# Patient Record
Sex: Female | Born: 1990 | Race: White | Hispanic: No | Marital: Single | Smoking: Never smoker
Health system: Southern US, Community
[De-identification: ages and names within clinical notes are randomized; demographics above are authoritative.]

---
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
## 2012-05-12 ENCOUNTER — Other Ambulatory Visit: Payer: Self-pay | Admitting: Nurse Practitioner

## 2012-05-13 ENCOUNTER — Other Ambulatory Visit: Payer: Self-pay | Admitting: Nurse Practitioner

## 2012-05-13 ENCOUNTER — Telehealth: Payer: Self-pay | Admitting: Nurse Practitioner

## 2012-05-13 NOTE — Telephone Encounter (Signed)
Patient needs a refill of her birth control to [facility] in [place]

## 2012-05-14 ENCOUNTER — Other Ambulatory Visit: Payer: Self-pay | Admitting: Nurse Practitioner

## 2012-05-14 MED ORDER — NORETHIN ACE-ETH ESTRAD-FE 1-20 MG-MCG PO TABS: 1.0000 | ORAL_TABLET | Freq: Every day | ORAL | Status: DC

## 2012-06-22 ENCOUNTER — Encounter: Payer: Self-pay | Admitting: *Deleted

## 2012-06-26 ENCOUNTER — Encounter: Payer: Self-pay | Admitting: Nurse Practitioner

## 2012-07-11 ENCOUNTER — Encounter: Payer: Self-pay | Admitting: Nurse Practitioner

## 2012-08-01 ENCOUNTER — Telehealth: Payer: Self-pay | Admitting: Nurse Practitioner

## 2012-08-01 NOTE — Telephone Encounter (Signed)
Patient needs a refill of her birth control [address]

## 2012-08-02 ENCOUNTER — Other Ambulatory Visit: Payer: Self-pay | Admitting: Nurse Practitioner

## 2012-08-02 MED ORDER — NORETHIN ACE-ETH ESTRAD-FE 1-20 MG-MCG PO TABS: 1.0000 | ORAL_TABLET | Freq: Every day | ORAL | Status: DC

## 2012-08-14 ENCOUNTER — Encounter: Payer: Self-pay | Admitting: Nurse Practitioner

## 2012-08-14 ENCOUNTER — Ambulatory Visit (INDEPENDENT_AMBULATORY_CARE_PROVIDER_SITE_OTHER): Payer: Self-pay | Admitting: Nurse Practitioner

## 2012-08-14 VITALS — BP 110/70 | HR 80 | Ht 62.0 in | Wt 126.2 lb

## 2012-08-14 DIAGNOSIS — Z634 Disappearance and death of family member: Secondary | ICD-10-CM | POA: Insufficient documentation

## 2012-08-14 DIAGNOSIS — F329 Major depressive disorder, single episode, unspecified: Secondary | ICD-10-CM

## 2012-08-14 DIAGNOSIS — Z733 Stress, not elsewhere classified: Secondary | ICD-10-CM

## 2012-08-14 DIAGNOSIS — F4321 Adjustment disorder with depressed mood: Secondary | ICD-10-CM

## 2012-08-14 DIAGNOSIS — R5383 Other fatigue: Secondary | ICD-10-CM

## 2012-08-14 LAB — POCT HEMOGLOBIN: Hemoglobin: 12 g/dL — AB (ref 12.2–16.2)

## 2012-08-14 MED ORDER — NORETHIN ACE-ETH ESTRAD-FE 1-20 MG-MCG PO TABS: 1.0000 | ORAL_TABLET | Freq: Every day | ORAL | Status: DC

## 2012-08-14 MED ORDER — CITALOPRAM HYDROBROMIDE 20 MG PO TABS: 20.0000 mg | ORAL_TABLET | Freq: Every day | ORAL | Status: DC

## 2012-08-14 NOTE — Assessment & Plan Note (Signed)
Plan: Start Celexa 20 mg half tab by mouth each bedtime. Recommend applying for Medicaid. Patient is currently uninsured. Patient to call local mental health center to schedule counseling. Recheck in 3 weeks, call back sooner if any problems. 

## 2012-08-14 NOTE — Progress Notes (Signed)
Subjective:  Presents for complaints of fatigue over the past several months. Sleep disturbance. Emotional lability. Increase appetite. Increase in headaches. Bowel issues. No acid reflux or heartburn. Generalized muscle aches. Lost her brother suddenly due to a car accident about 6 months ago. Lost her grandfather 2 weeks ago. Denies any suicidal or homicidal thoughts or ideation. Menstrual cycles are regular with light flow lasting 5-6 days. No missed pills. Has been with the same sexual partner. No pelvic pain or discharge. Has not been eating very healthy. Limited exercise. Mother is present with her today per her request.  Objective:   BP 110/70  Pulse 80  Ht 5\' 2"  (1.575 m)  Wt 126 lb 4 oz (57.267 kg)  BMI 23.09 kg/m2  LMP 08/07/2012 NAD. Alert, oriented. Mildly depressed affect. Lungs clear. Heart regular rate rhythm. Abdomen soft nondistended with minimal epigastric area tenderness.  Assessment:Depression  Other malaise and fatigue - Plan: POCT hemoglobin  Bereavement  Plan: Start Celexa 20 mg half tab by mouth each bedtime. Recommend applying for Medicaid. Patient is currently uninsured. Patient to call local mental health center to schedule counseling. Recheck in 3 weeks, call back sooner if any problems. Also given one year refill on her birth control pills. OTC Prilosec for her stomach. Recommend daily multivitamin with iron. Healthy diet. Increase activity.

## 2012-08-14 NOTE — Assessment & Plan Note (Signed)
Plan: Start Celexa 20 mg half tab by mouth each bedtime. Recommend applying for Medicaid. Patient is currently uninsured. Patient to call local mental health center to schedule counseling. Recheck in 3 weeks, call back sooner if any problems.

## 2012-08-14 NOTE — Patient Instructions (Signed)
Celexa (Citalopram) 1/2 at bedtime

## 2012-09-04 ENCOUNTER — Ambulatory Visit: Payer: Self-pay | Admitting: Nurse Practitioner

## 2013-08-29 ENCOUNTER — Other Ambulatory Visit: Payer: Self-pay | Admitting: Nurse Practitioner

## 2013-09-01 ENCOUNTER — Other Ambulatory Visit: Payer: Self-pay | Admitting: Nurse Practitioner

## 2013-09-10 ENCOUNTER — Telehealth: Payer: Self-pay | Admitting: Family Medicine

## 2013-09-10 MED ORDER — NORETHIN ACE-ETH ESTRAD-FE 1-20 MG-MCG PO TABS: ORAL_TABLET | ORAL | Status: DC

## 2013-09-10 NOTE — Telephone Encounter (Signed)
Pt needs refill on her BC pills, physical appt is 09/25/13 with Eber Jonesarolyn, [facility] the [facility]  MICROGESTIN FE 1/20 1-20 MG-MCG tablet

## 2013-09-10 NOTE — Telephone Encounter (Signed)
Med refill sent. Patient notified and verbalized understanding that she needs to keep her appt on 09/25/13

## 2013-09-10 NOTE — Telephone Encounter (Signed)
Correction, [facility] in [place]

## 2013-09-25 ENCOUNTER — Ambulatory Visit (INDEPENDENT_AMBULATORY_CARE_PROVIDER_SITE_OTHER): Payer: Self-pay | Admitting: Nurse Practitioner

## 2013-09-25 ENCOUNTER — Encounter: Payer: Self-pay | Admitting: Nurse Practitioner

## 2013-09-25 VITALS — BP 118/80 | Ht 62.0 in | Wt 131.0 lb

## 2013-09-25 DIAGNOSIS — Z01419 Encounter for gynecological examination (general) (routine) without abnormal findings: Secondary | ICD-10-CM

## 2013-09-25 DIAGNOSIS — F411 Generalized anxiety disorder: Secondary | ICD-10-CM

## 2013-09-25 DIAGNOSIS — Z0000 Encounter for general adult medical examination without abnormal findings: Secondary | ICD-10-CM

## 2013-09-25 DIAGNOSIS — F329 Major depressive disorder, single episode, unspecified: Secondary | ICD-10-CM

## 2013-09-25 DIAGNOSIS — F3289 Other specified depressive episodes: Secondary | ICD-10-CM

## 2013-09-25 DIAGNOSIS — F32A Depression, unspecified: Secondary | ICD-10-CM

## 2013-09-25 MED ORDER — NORETHIN ACE-ETH ESTRAD-FE 1-20 MG-MCG PO TABS: ORAL_TABLET | ORAL | Status: DC

## 2013-09-25 MED ORDER — CITALOPRAM HYDROBROMIDE 20 MG PO TABS: 20.0000 mg | ORAL_TABLET | Freq: Every day | ORAL | Status: DC

## 2013-09-25 MED ORDER — CLONAZEPAM 0.5 MG PO TABS: ORAL_TABLET | ORAL | Status: DC

## 2013-09-25 NOTE — Progress Notes (Signed)
Subjective:    Patient ID: Sherry Acosta, female    DOB: [birth year], 23 y.o.   MRN: [id number]  HPI presents with her mother for wellness physical. Sherry Acosta patient alone and with mother. Limited activity. Diet not very healthy. Not picky. Regular vision exams. Due for dental exam. Regular menses, light flow lasting 5 days. Recently broke up with her boyfriend, no new partners since last PE. Having depression and anxiety symptoms. See note July 2014. Did not take Celexa due to nonspecific concerns about side effects. Wants to start this. C/o fatigue, sleep disturbance and emotional lability. Denies suicidal or homicidal thoughts or ideation. Panic attacks at times. Her mother has given her one of her xanax pills on occasion. Has experienced depression off/on for years.     Review of Systems  Constitutional: Positive for fatigue. Negative for fever, activity change and appetite change.  HENT: Negative for congestion, dental problem, ear pain, sinus pressure and sore throat.   Respiratory: Negative for cough, chest tightness, shortness of breath and wheezing.   Cardiovascular: Negative for chest pain.  Gastrointestinal: Positive for diarrhea. Negative for nausea, vomiting, abdominal pain, constipation, blood in stool and abdominal distention.       No GERD or heartburn.  Genitourinary: Negative for dysuria, urgency, frequency, vaginal discharge, enuresis, difficulty urinating, menstrual problem and pelvic pain.  Neurological: Positive for headaches.       Dull occipital area headaches.  Psychiatric/Behavioral: Positive for sleep disturbance, dysphoric mood and agitation. Negative for suicidal ideas. The patient is nervous/anxious.        Objective:   Physical Exam  Constitutional: She is oriented to person, place, and time. She appears well-developed. No distress.  HENT:  Right Ear: External ear normal.  Left Ear: External ear normal.  Mouth/Throat: Oropharynx is clear and moist.    Neck: Normal range of motion. Neck supple. No tracheal deviation present.  Cardiovascular: Normal rate, regular rhythm and normal heart sounds.  Exam reveals no gallop.   No murmur heard. Pulmonary/Chest: Effort normal and breath sounds normal.  Abdominal: Soft. She exhibits no distension. There is no tenderness.  Genitourinary:  Deferred on cycle today.  Musculoskeletal: She exhibits no edema.  Lymphadenopathy:    She has no cervical adenopathy.  Neurological: She is alert and oriented to person, place, and time.  Skin: Skin is warm and dry. No rash noted.  Psychiatric: She has a normal mood and affect. Her behavior is normal. Thought content normal.  Breast exam: dense tissue, no masses; axillae no adenopathy.        Assessment & Plan:  Well woman exam  Depression  Anxiety state, unspecified  Meds ordered this encounter  Medications  . norethindrone-ethinyl estradiol (MICROGESTIN FE 1/20) 1-20 MG-MCG tablet    Sig: TAKE ONE TABLET BY MOUTH ONCE DAILY    Dispense:  1 Package    Refill:  11    Order Specific Question:  Supervising Provider    Answer:  Merlyn Albert [[id number]]  . citalopram (CELEXA) 20 MG tablet    Sig: Take 1 tablet (20 mg total) by mouth daily. At bedtime    Dispense:  30 tablet    Refill:  0    Order Specific Question:  Supervising Provider    Answer:  Merlyn Albert [[id number]]  . clonazePAM (KLONOPIN) 0.5 MG tablet    Sig: 1/2-1 tab po BID prn anxiety attacks    Dispense:  20 tablet    Refill:  0  Order Specific Question:  Supervising Provider    Answer:  Merlyn Albert [[id number]]   Start celexa 20 1/2 tab po qhs. If no problems, increase to one tab after 6 days. D/C med and call if any problems. Use klonopin sparingly, only for anxiety attacks. Defers labs and mental health counseling at this time. Encouraged healthy diet and regular activity.  Return in about 1 month (around 10/25/2013).

## 2013-10-24 ENCOUNTER — Ambulatory Visit: Payer: Self-pay | Admitting: Nurse Practitioner

## 2013-10-26 ENCOUNTER — Other Ambulatory Visit: Payer: Self-pay | Admitting: Nurse Practitioner

## 2013-11-24 ENCOUNTER — Other Ambulatory Visit: Payer: Self-pay | Admitting: Family Medicine

## 2013-11-24 MED ORDER — CITALOPRAM HYDROBROMIDE 20 MG PO TABS: ORAL_TABLET | ORAL | Status: DC

## 2013-11-24 NOTE — Telephone Encounter (Signed)
Patient needs refill on citalopram (CELEXA) 20 MG tablet  [facility] [place]

## 2013-11-24 NOTE — Telephone Encounter (Signed)
Patient notified that medication refill has been sent to pharmacy.

## 2019-12-30 ENCOUNTER — Other Ambulatory Visit: Payer: Self-pay

## 2019-12-30 ENCOUNTER — Ambulatory Visit: Payer: Managed Care, Other (non HMO) | Admitting: Podiatry

## 2019-12-30 ENCOUNTER — Ambulatory Visit (INDEPENDENT_AMBULATORY_CARE_PROVIDER_SITE_OTHER): Payer: Managed Care, Other (non HMO)

## 2019-12-30 ENCOUNTER — Telehealth: Payer: Self-pay

## 2019-12-30 DIAGNOSIS — S90859A Superficial foreign body, unspecified foot, initial encounter: Secondary | ICD-10-CM

## 2019-12-30 DIAGNOSIS — M79671 Pain in right foot: Secondary | ICD-10-CM

## 2019-12-30 DIAGNOSIS — S90851A Superficial foreign body, right foot, initial encounter: Secondary | ICD-10-CM | POA: Diagnosis not present

## 2019-12-30 NOTE — Patient Instructions (Signed)

## 2019-12-30 NOTE — Telephone Encounter (Signed)
DOS 12/31/2019  EXC FOREIGN BODY RT - 28020 REMOVAL FOREIGN BODY - 10120  [employer] EFFECTIVE DATE - 01/24/2019  PLAN DEDUCTIBLE - $3750.00 W/ $0.00 MET OUT OF POCKET - $7900.00 W/ $50.00 MET COPAY $0.00 COINSURANCE - 70%  PER AUTOMATED SYSTEM NO PRECERT IS REQUIRED FOR CPT 28020 CONF # 21006 AND 77939 CONF # [id number]

## 2020-01-05 NOTE — Progress Notes (Signed)
Subjective:   Patient ID: Sherry Acosta, female   DOB: 29 y.o.   MRN: [id number]   HPI 29 year old female presents the office today for concerns of foreign body in her right foot.  She states that she stepped on a piece of glass on December 14, 2019.  She states that she was unable to get it all out.  She did see another physician for this and they tried to get it out but was not able to do so.  The patient feels that the glasses moved.  The area is tender with pressure.  She has no other concerns today.  Currently denies any fevers, chills, nausea, vomiting.   Review of Systems  All other systems reviewed and are negative.  No past medical history on file.  No past surgical history on file.   Current Outpatient Medications:    citalopram (CELEXA) 20 MG tablet, TAKE ONE TABLET BY MOUTH AT BEDTIME, Disp: 30 tablet, Rfl: 5   clonazePAM (KLONOPIN) 0.5 MG tablet, 1/2-1 tab po BID prn anxiety attacks, Disp: 20 tablet, Rfl: 0   norethindrone-ethinyl estradiol (MICROGESTIN FE 1/20) 1-20 MG-MCG tablet, TAKE ONE TABLET BY MOUTH ONCE DAILY, Disp: 1 Package, Rfl: 11  Allergies  Allergen Reactions   Amoxicillin     rash   Strawberry Extract     Causes hives    Sulfa Antibiotics Hives        Objective:  Physical Exam  General: AAO x3, NAD  Dermatological: No open puncture wound is evident but able to visualize where likely the foreign body entered.  There is localized edema to the area there is no erythema.  There is no fluctuation or crepitation.  There is no malodor.  Vascular: Dorsalis Pedis artery and Posterior Tibial artery pedal pulses are 2/4 bilateral with immedate capillary fill time. There is no pain with calf compression, swelling, warmth, erythema.   Neruologic: Grossly intact via light touch bilateral.   Musculoskeletal: Tenderness on the area of the foreign body.  No other areas of discomfort.  Gait: Unassisted, Nonantalgic.       Assessment:   Likely  residual foreign body right foot     Plan:  -Treatment options discussed including all alternatives, risks, and complications -Etiology of symptoms were discussed -X-rays obtained reviewed.  No evidence of foreign body identified on x-ray. -Given there is been there for couple weeks and that also try to get him previously not able to do so I recommended incision and drainage, foreign body excision in the operating room.  We will plan on doing this on Wednesday. -The incision placement as well as the postoperative course was discussed with the patient. I discussed risks of the surgery which include, but not limited to, infection, bleeding, pain, swelling, need for further surgery, delayed or nonhealing, painful or ugly scar, numbness or sensation changes, over/under correction, recurrence, transfer lesions, further deformity, DVT/PE, loss of toe/foot. Patient understands these risks and wishes to proceed with surgery. The surgical consent was reviewed with the patient all 3 pages were signed. No promises or guarantees were given to the outcome of the procedure. All questions were answered to the best of my ability. Before the surgery the patient was encouraged to call the office if there is any further questions. The surgery will be performed at the [facility] on an outpatient basis.  No follow-ups on file.  Vivi Barrack DPM

## 2020-01-06 ENCOUNTER — Encounter: Payer: Managed Care, Other (non HMO) | Admitting: Podiatry

## 2020-01-15 ENCOUNTER — Encounter: Payer: Managed Care, Other (non HMO) | Admitting: Podiatry

## 2020-01-29 ENCOUNTER — Encounter: Payer: Managed Care, Other (non HMO) | Admitting: Podiatry

## 2020-12-07 ENCOUNTER — Emergency Department (HOSPITAL_COMMUNITY)
Admission: EM | Admit: 2020-12-07 | Discharge: 2020-12-08 | Disposition: A | Discharge status: Home / Self Care | Payer: Managed Care, Other (non HMO) | Attending: Emergency Medicine | Admitting: Emergency Medicine

## 2020-12-07 ENCOUNTER — Other Ambulatory Visit: Payer: Self-pay

## 2020-12-07 DIAGNOSIS — D649 Anemia, unspecified: Secondary | ICD-10-CM | POA: Insufficient documentation

## 2020-12-07 DIAGNOSIS — R0789 Other chest pain: Secondary | ICD-10-CM | POA: Insufficient documentation

## 2020-12-07 DIAGNOSIS — M791 Myalgia, unspecified site: Secondary | ICD-10-CM | POA: Diagnosis not present

## 2020-12-07 DIAGNOSIS — R079 Chest pain, unspecified: Secondary | ICD-10-CM | POA: Diagnosis present

## 2020-12-07 DIAGNOSIS — Z79899 Other long term (current) drug therapy: Secondary | ICD-10-CM | POA: Insufficient documentation

## 2020-12-07 NOTE — ED Provider Notes (Signed)
Emergency Medicine Provider Triage Evaluation Note  Sherry Acosta , a 29 y.o. female  was evaluated in triage.  Pt complains of left upper chest pain worse with movement but not exertion.  Review of Systems  Positive: Chest pain, recent cough Negative: Shortness of breath, leg swelling, exertional symptoms,  Physical Exam  BP (!) 150/84 (BP Location: Right Arm)   Pulse (!) 106   Temp 98 F (36.7 C) (Oral)   Resp 16   Ht 5\' 2"  (1.575 m)   Wt 88 kg   SpO2 100%   BMI 35.48 kg/m  Gen:   Awake, no distress   Resp:  Normal effort  MSK:   Moves extremities without difficulty  Other:    Medical Decision Making  Medically screening exam initiated at 11:47 PM.  Appropriate orders placed.  Sherry Acosta was informed that the remainder of the evaluation will be completed by another provider, this initial triage assessment does not replace that evaluation, and the importance of remaining in the ED until their evaluation is complete.     Daune Perch, MD 12/07/20 [phone]

## 2020-12-07 NOTE — ED Triage Notes (Signed)
Pt had seen her PCP for chest pain and had labs ordered which resulted and she was called and told to come to the ED for an elevated D Dimer

## 2020-12-08 ENCOUNTER — Emergency Department (HOSPITAL_COMMUNITY): Payer: Managed Care, Other (non HMO)

## 2020-12-08 LAB — CBC WITH DIFFERENTIAL/PLATELET
Abs Immature Granulocytes: 0.03 10*3/uL (ref 0.00–0.07)
Basophils Absolute: 0.1 10*3/uL (ref 0.0–0.1)
Basophils Relative: 1 %
Eosinophils Absolute: 0.4 10*3/uL (ref 0.0–0.5)
Eosinophils Relative: 5 %
HCT: 30.3 % — ABNORMAL LOW (ref 36.0–46.0)
Hemoglobin: 9.4 g/dL — ABNORMAL LOW (ref 12.0–15.0)
Immature Granulocytes: 0 %
Lymphocytes Relative: 29 %
Lymphs Abs: 2.1 10*3/uL (ref 0.7–4.0)
MCH: 24.9 pg — ABNORMAL LOW (ref 26.0–34.0)
MCHC: 31 g/dL (ref 30.0–36.0)
MCV: 80.2 fL (ref 80.0–100.0)
Monocytes Absolute: 0.6 10*3/uL (ref 0.1–1.0)
Monocytes Relative: 8 %
Neutro Abs: 4.1 10*3/uL (ref 1.7–7.7)
Neutrophils Relative %: 57 %
Platelets: 433 10*3/uL — ABNORMAL HIGH (ref 150–400)
RBC: 3.78 MIL/uL — ABNORMAL LOW (ref 3.87–5.11)
RDW: 14.6 % (ref 11.5–15.5)
WBC: 7.2 10*3/uL (ref 4.0–10.5)
nRBC: 0 % (ref 0.0–0.2)

## 2020-12-08 LAB — COMPREHENSIVE METABOLIC PANEL
ALT: 18 U/L (ref 0–44)
AST: 23 U/L (ref 15–41)
Albumin: 3.2 g/dL — ABNORMAL LOW (ref 3.5–5.0)
Alkaline Phosphatase: 62 U/L (ref 38–126)
Anion gap: 8 (ref 5–15)
BUN: 14 mg/dL (ref 6–20)
CO2: 23 mmol/L (ref 22–32)
Calcium: 8.7 mg/dL — ABNORMAL LOW (ref 8.9–10.3)
Chloride: 106 mmol/L (ref 98–111)
Creatinine, Ser: 0.71 mg/dL (ref 0.44–1.00)
GFR, Estimated: >60 mL/min (ref >60)
Glucose, Bld: 99 mg/dL (ref 70–99)
Potassium: 3.9 mmol/L (ref 3.5–5.1)
Sodium: 137 mmol/L (ref 135–145)
Total Bilirubin: 0.4 mg/dL (ref 0.3–1.2)
Total Protein: 6.8 g/dL (ref 6.5–8.1)

## 2020-12-08 LAB — D-DIMER, QUANTITATIVE: D-Dimer, Quant: 1.01 ug/mL-FEU — ABNORMAL HIGH (ref 0.00–0.50)

## 2020-12-08 LAB — HCG, QUANTITATIVE, PREGNANCY: hCG, Beta Chain, Quant, S: <1 m[IU]/mL (ref <5)

## 2020-12-08 MED ORDER — IOHEXOL 350 MG/ML SOLN
50.0000 mL | Freq: Once | INTRAVENOUS | Status: AC | PRN
  Administered 2020-12-08: 50 mL via INTRAVENOUS

## 2020-12-08 NOTE — ED Notes (Signed)
Pt discharged and ambulated out of the ED without difficulty. 

## 2020-12-08 NOTE — ED Provider Notes (Signed)
[facility] EMERGENCY DEPARTMENT Provider Note   CSN: [id number] Arrival date & time: 12/07/20  2301     History Chief Complaint  Patient presents with   Abnormal Lab    Sherry Acosta is a 30 y.o. female.  30 year old female who presents emerged from today secondary to left-sided chest pain.  Worse with movement and palpation.  She went to see her primary doctor who ordered a D-dimer which was elevated so sent her here for CT scan.  She has no shortness of breath.  No fevers.  No productive cough.  No lower extremity swelling or pain   Abnormal Lab     No past medical history on file.  Patient Active Problem List   Diagnosis Date Noted   Anxiety state, unspecified 09/25/2013   Bereavement 08/14/2012   Depression 08/14/2012    No past surgical history on file.   OB History   No obstetric history on file.     Family History  Problem Relation Age of Onset   Cancer Maternal Grandmother 49       Lung cancer; smoker   COPD Paternal Grandmother    Thyroid disease Mother    Mental illness Mother     Social History   Tobacco Use   Smoking status: Never   Smokeless tobacco: Never  Substance Use Topics   Alcohol use: Yes    Comment: occas social   Drug use: No    Home Medications Prior to Admission medications   Medication Sig Start Date End Date Taking? Authorizing Provider  citalopram (CELEXA) 20 MG tablet TAKE ONE TABLET BY MOUTH AT BEDTIME 11/24/13   Babs Sciara, MD  clonazePAM (KLONOPIN) 0.5 MG tablet 1/2-1 tab po BID prn anxiety attacks 09/25/13   Campbell Riches, NP  norethindrone-ethinyl estradiol (MICROGESTIN FE 1/20) 1-20 MG-MCG tablet TAKE ONE TABLET BY MOUTH ONCE DAILY 09/25/13   Campbell Riches, NP    Allergies    Amoxicillin, Strawberry extract, and Sulfa antibiotics  Review of Systems   Review of Systems  All other systems reviewed and are negative.  Physical Exam Updated Vital Signs BP 132/88   Pulse 87    Temp 98.1 F (36.7 C) (Oral)   Resp 16   Ht 5\' 2"  (1.575 m)   Wt 88 kg   SpO2 100%   BMI 35.48 kg/m   Physical Exam Vitals and nursing note reviewed.  Constitutional:      Appearance: She is well-developed.  HENT:     Head: Normocephalic and atraumatic.     Mouth/Throat:     Mouth: Mucous membranes are moist.     Pharynx: Oropharynx is clear.  Eyes:     Pupils: Pupils are equal, round, and reactive to light.  Cardiovascular:     Rate and Rhythm: Normal rate and regular rhythm.  Pulmonary:     Effort: No respiratory distress.     Breath sounds: No stridor.  Abdominal:     General: Abdomen is flat. There is no distension.  Musculoskeletal:        General: Tenderness (left upper chest) present.     Cervical back: Normal range of motion.  Skin:    General: Skin is warm and dry.  Neurological:     General: No focal deficit present.     Mental Status: She is alert.    ED Results / Procedures / Treatments   Labs (all labs ordered are listed, but only abnormal results are displayed)  Labs Reviewed  CBC WITH DIFFERENTIAL/PLATELET - Abnormal; Notable for the following components:      Result Value   RBC 3.78 (*)    Hemoglobin 9.4 (*)    HCT 30.3 (*)    MCH 24.9 (*)    Platelets 433 (*)    All other components within normal limits  COMPREHENSIVE METABOLIC PANEL - Abnormal; Notable for the following components:   Calcium 8.7 (*)    Albumin 3.2 (*)    All other components within normal limits  D-DIMER, QUANTITATIVE - Abnormal; Notable for the following components:   D-Dimer, Quant 1.01 (*)    All other components within normal limits  HCG, QUANTITATIVE, PREGNANCY    EKG None  Radiology DG Chest 2 View  Result Date: 12/08/2020 CLINICAL DATA:  Left-sided chest pain. EXAM: CHEST - 2 VIEW COMPARISON:  None. FINDINGS: The cardiomediastinal contours are normal. The lungs are clear. Pulmonary vasculature is normal. No consolidation, pleural effusion, or pneumothorax. No  acute osseous abnormalities are seen. IMPRESSION: Negative radiographs of the chest. Electronically Signed   By: Narda Rutherford M.D.   On: 12/08/2020 00:12   CT Angio Chest PE W and/or Wo Contrast  Result Date: 12/08/2020 CLINICAL DATA:  Elevated D-dimer and chest pain, initial encounter EXAM: CT ANGIOGRAPHY CHEST WITH CONTRAST TECHNIQUE: Multidetector CT imaging of the chest was performed using the standard protocol during bolus administration of intravenous contrast. Multiplanar CT image reconstructions and MIPs were obtained to evaluate the vascular anatomy. CONTRAST:  50mL OMNIPAQUE IOHEXOL 350 MG/ML SOLN COMPARISON:  Chest x-ray from earlier in the same day. FINDINGS: Cardiovascular: Thoracic aorta and its branches show no significant atherosclerotic calcifications. No aneurysmal dilatation or dissection is seen. No cardiac enlargement is noted. Pulmonary artery shows a normal branching pattern bilaterally. No intraluminal filling defect to suggest pulmonary embolism is identified. Mediastinum/Nodes: Thoracic inlet is within normal limits. No sizable hilar or mediastinal adenopathy is noted. Scattered mildly prominent but symmetrical axillary lymph nodes are noted measuring up to 10 mm in short axis. The esophagus as visualized is within normal limits. Lungs/Pleura: Lungs are well aerated bilaterally. No focal infiltrate or sizable effusion is seen. No parenchymal nodules are noted. Upper Abdomen: Visualized upper abdomen is unremarkable. Musculoskeletal: No acute rib abnormality is noted. No acute bony abnormality is seen. Review of the MIP images confirms the above findings. IMPRESSION: No evidence of pulmonary emboli. No infiltrate or effusion is seen. Symmetrical axillary lymph nodes are seen and mildly prominent likely reactive in nature. No other focal abnormality is identified. Electronically Signed   By: Alcide Clever M.D.   On: 12/08/2020 02:34    Procedures Procedures   Medications Ordered  in ED Medications  iohexol (OMNIPAQUE) 350 MG/ML injection 50 mL (50 mLs Intravenous Contrast Given 12/08/20 0216)    ED Course  I have reviewed the triage vital signs and the nursing notes.  Pertinent labs & imaging results that were available during my care of the patient were reviewed by me and considered in my medical decision making (see chart for details).    MDM Rules/Calculators/A&P                         Patient with likely musculoskeletal causes for her pain.  Her D-dimer was elevated here as well so a CT scan was done which was negative.  Patient stable for discharge.  She was found to have anemia.  The nurse discharged her before I did chance  to discuss with her so I called her on the phone and informed her that her hemoglobin is a little bit low.  Not requiring transfusion not likely related to her symptoms more likely related to her menstrual cycle she is on currently.  She will follow-up with her primary doctor for both these issues.   Final Clinical Impression(s) / ED Diagnoses Final diagnoses:  Anemia, unspecified type  Musculoskeletal chest pain    Rx / DC Orders ED Discharge Orders     None        Aryianna Earwood, Barbara Cower, MD 12/08/20 [phone]

## 2020-12-31 ENCOUNTER — Other Ambulatory Visit: Payer: Self-pay

## 2020-12-31 ENCOUNTER — Institutional Professional Consult (permissible substitution): Payer: Managed Care, Other (non HMO) | Admitting: Pulmonary Disease

## 2021-01-04 ENCOUNTER — Other Ambulatory Visit: Payer: Self-pay

## 2021-01-04 ENCOUNTER — Ambulatory Visit: Payer: Managed Care, Other (non HMO) | Admitting: Pulmonary Disease

## 2021-01-04 ENCOUNTER — Encounter: Payer: Self-pay | Admitting: Pulmonary Disease

## 2021-01-04 VITALS — BP 118/82 | HR 92 | Temp 98.2°F | Ht 62.0 in | Wt 192.0 lb

## 2021-01-04 DIAGNOSIS — J4540 Moderate persistent asthma, uncomplicated: Secondary | ICD-10-CM

## 2021-01-04 DIAGNOSIS — R053 Chronic cough: Secondary | ICD-10-CM

## 2021-01-04 MED ORDER — FLUTICASONE PROPIONATE 50 MCG/ACT NA SUSP: 2.0000 | Freq: Every day | NASAL | 6 refills | Status: DC

## 2021-01-04 MED ORDER — FLUTICASONE-SALMETEROL 500-50 MCG/ACT IN AEPB: 1.0000 | INHALATION_SPRAY | Freq: Two times a day (BID) | RESPIRATORY_TRACT | 6 refills | Status: DC

## 2021-01-04 NOTE — Patient Instructions (Addendum)
Nice to meet you  I think the cough is likely related to your nasal congestion as well as possibly related to underlying asthma  For the nasal congestion I recommend the following  1) sinus rinse twice a day for 2 weeks then once daily after 2 weeks  2) Flonase 2 sprays each nostril twice a day for 7 days then 2 sprays each nostril once daily -I sent a prescription but it may be cheaper over-the-counter, just check the price difference  3) Afrin 2 sprays each nostril twice a day for 3 days then stop  4) new medication a pill cutter montelukast 10 mg at night  5) continue Xyzal as you are taking  Please make sure to do all of the medications after you do the sinus rinse.  If you do the sinus rinse last, able to wash out any medication.  For the chest tightness which I believe is related to asthma, use Advair 1 puff twice a day.  Please rinse your mouth out after every use.  If cost is too high, ask the pharmacist if there is a cheaper option with your insurance plan and then let us know.  If they are not helpful, please contact our office for further instructions.  Return to clinic in 2 months or sooner as needed with Dr. Judeth Horn

## 2021-01-05 NOTE — Progress Notes (Signed)
@Patient  ID: , female    DOB: [birth year], 30 y.o.   MRN: [id number]  Chief Complaint  Patient presents with   Consult    Consult for prolonged cough and chest pain that has been present for 2 months and nasal congestion for 4 years.     Referring provider: [id number], MD  HPI:   30 y.o. whom we are seeing in consultation for evaluation of chronic cough and dyspnea on exertion.  Note from referring provider reviewed.  ED note 12/08/2020 reviewed.  Patient reports onset of dyspnea exertion over the last few months.  Worse over the last few weeks.  Associated chest tightness, inability to take deep breath.  Seems a bit worse in the evenings.  No other time there were things better or worse.  No position makes it better or worse.  No seasonal environmental factors she can identify that make things better or worse.  No other alleviating or exacerbating factors.  Seems to be triggered by viral infection.  She was seen in the ED 11/15 into 12/08/2020.  Chest x-ray 11/16 reviewed interpreted as clear lungs bilaterally.  CTA PE protocol 11/16 reviewed interpreted as clear lungs bilaterally, no parenchymal issues, no pulmonary embolus identified on my interpretation.  She reports longstanding history of seasonal allergies.  A lot of nasal congestion, sinus congestion.  Chronic cough for about 4 years.  Worse in the evenings.  Has tried several antihistamines without much improvement.  She endorsed some face tingling at times.  PMH: Seasonal allergies, anxiety Surgical history:History reviewed. No pertinent surgical history. Family history: Mother thyroid disease, mental illness Social history: Never smoker, lives in [place], works at [employer] / Pulmonary Flowsheets:   ACT:  No flowsheet data found.  MMRC: No flowsheet data found.  Epworth:  No flowsheet data found.  Tests:   FENO:  No results found for: NITRICOXIDE  PFT: No flowsheet data  found.  WALK:  No flowsheet data found.  Imaging: Personally reviewed and as per EMR discussion of this note DG Chest 2 View  Result Date: 12/08/2020 CLINICAL DATA:  Left-sided chest pain. EXAM: CHEST - 2 VIEW COMPARISON:  None. FINDINGS: The cardiomediastinal contours are normal. The lungs are clear. Pulmonary vasculature is normal. No consolidation, pleural effusion, or pneumothorax. No acute osseous abnormalities are seen. IMPRESSION: Negative radiographs of the chest. Electronically Signed   By: 12/10/2020 M.D.   On: 12/08/2020 00:12   CT Angio Chest PE W and/or Wo Contrast  Result Date: 12/08/2020 CLINICAL DATA:  Elevated D-dimer and chest pain, initial encounter EXAM: CT ANGIOGRAPHY CHEST WITH CONTRAST TECHNIQUE: Multidetector CT imaging of the chest was performed using the standard protocol during bolus administration of intravenous contrast. Multiplanar CT image reconstructions and MIPs were obtained to evaluate the vascular anatomy. CONTRAST:  56mL OMNIPAQUE IOHEXOL 350 MG/ML SOLN COMPARISON:  Chest x-ray from earlier in the same day. FINDINGS: Cardiovascular: Thoracic aorta and its branches show no significant atherosclerotic calcifications. No aneurysmal dilatation or dissection is seen. No cardiac enlargement is noted. Pulmonary artery shows a normal branching pattern bilaterally. No intraluminal filling defect to suggest pulmonary embolism is identified. Mediastinum/Nodes: Thoracic inlet is within normal limits. No sizable hilar or mediastinal adenopathy is noted. Scattered mildly prominent but symmetrical axillary lymph nodes are noted measuring up to 10 mm in short axis. The esophagus as visualized is within normal limits. Lungs/Pleura: Lungs are well aerated bilaterally. No focal infiltrate or sizable effusion is seen. No parenchymal  nodules are noted. Upper Abdomen: Visualized upper abdomen is unremarkable. Musculoskeletal: No acute rib abnormality is noted. No acute bony  abnormality is seen. Review of the MIP images confirms the above findings. IMPRESSION: No evidence of pulmonary emboli. No infiltrate or effusion is seen. Symmetrical axillary lymph nodes are seen and mildly prominent likely reactive in nature. No other focal abnormality is identified. Electronically Signed   By: Alcide Clever M.D.   On: 12/08/2020 02:34    Lab Results: Personally reviewed, notably eosinophils 400 in past CBC    Component Value Date/Time   WBC 7.2 12/07/2020 2352   RBC 3.78 (L) 12/07/2020 2352   HGB 9.4 (L) 12/07/2020 2352   HCT 30.3 (L) 12/07/2020 2352   PLT 433 (H) 12/07/2020 2352   MCV 80.2 12/07/2020 2352   MCH 24.9 (L) 12/07/2020 2352   MCHC 31.0 12/07/2020 2352   RDW 14.6 12/07/2020 2352   LYMPHSABS 2.1 12/07/2020 2352   MONOABS 0.6 12/07/2020 2352   EOSABS 0.4 12/07/2020 2352   BASOSABS 0.1 12/07/2020 2352    BMET    Component Value Date/Time   NA 137 12/07/2020 2352   K 3.9 12/07/2020 2352   CL 106 12/07/2020 2352   CO2 23 12/07/2020 2352   GLUCOSE 99 12/07/2020 2352   BUN 14 12/07/2020 2352   CREATININE 0.71 12/07/2020 2352   CALCIUM 8.7 (L) 12/07/2020 2352   GFRNONAA >60 12/07/2020 2352    BNP No results found for: BNP  ProBNP No results found for: PROBNP  Specialty Problems   None   Allergies  Allergen Reactions   Amoxicillin     rash   Strawberry Extract     Causes hives    Sulfa Antibiotics Hives     There is no immunization history on file for this patient.  History reviewed. No pertinent past medical history.  Tobacco History: Social History   Tobacco Use  Smoking Status Never  Smokeless Tobacco Never   Counseling given: Not Answered   Continue to not smoke  Outpatient Encounter Medications as of 01/04/2021  Medication Sig   FLUoxetine (PROZAC) 20 MG capsule 1 capsule   fluticasone (FLONASE) 50 MCG/ACT nasal spray Place 2 sprays into both nostrils daily.   fluticasone-salmeterol (ADVAIR) 500-50 MCG/ACT AEPB  Inhale 1 puff into the lungs in the morning and at bedtime.   norethindrone-ethinyl estradiol (MICROGESTIN FE 1/20) 1-20 MG-MCG tablet TAKE ONE TABLET BY MOUTH ONCE DAILY   citalopram (CELEXA) 20 MG tablet TAKE ONE TABLET BY MOUTH AT BEDTIME (Patient not taking: Reported on 01/04/2021)   clonazePAM (KLONOPIN) 0.5 MG tablet 1/2-1 tab po BID prn anxiety attacks (Patient not taking: Reported on 01/04/2021)   No facility-administered encounter medications on file as of 01/04/2021.     Review of Systems  Review of Systems  No chest evaluation.  No orthopnea or PND.  Comprehensive review of systems otherwise negative. Physical Exam  BP 118/82 (BP Location: Left Arm, Patient Position: Sitting, Cuff Size: Normal)    Pulse 92    Temp 98.2 F (36.8 C) (Oral)    Ht 5\' 2"  (1.575 m)    Wt 192 lb (87.1 kg)    SpO2 99%    BMI 35.12 kg/m   Wt Readings from Last 5 Encounters:  01/04/21 192 lb (87.1 kg)  12/07/20 194 lb (88 kg)  09/25/13 131 lb (59.4 kg)  08/14/12 126 lb 4 oz (57.3 kg)    BMI Readings from Last 5 Encounters:  01/04/21 35.12 kg/m  12/07/20 35.48 kg/m  09/25/13 23.96 kg/m  08/14/12 23.09 kg/m     Physical Exam General well-appearing, no acute distress Eyes: EOMI, icterus Neck: Supple, no JVP Pulmonary: Clear, no work of breathing, no wheeze Cardiovascular: Regular rate and rhythm, no murmur Abdomen: Nondistended, bowel sounds present MSK: No synovitis, no joint effusion Neuro: Normal gait, no weakness Psych: Normal mood, full affect   Assessment & Plan:   Chest tightness, dyspnea on exertion: Triggered by what sounds like viral infection and worsened recently.  Suspect postviral activation of asthma or reactive airways disease.  ICS/LABA therapy via high-dose Advair.  Chronic cough: Suspect other factorial in the setting of likely asthma, seasonal allergies/rhinitis, postnasal drip.  ICS/LABA as a above.  Flonase prescribed in addition.  To continue Xyzal.  She is  concerned about potential mast cell activation given facial tingling.  No frank hives.  No other consider allergy referral in the future if hives or symptoms were to worsen or escalation in nasal regimen is unsuccessful in helping cough.  Asthma: Clinical diagnosis in the setting of atopic symptoms, chest tightness and dyspnea triggered by viral infection.  ICS/LABA as above.  Return in about 2 months (around 03/07/2021).   Karren Burly, MD 01/05/2021

## 2021-02-01 ENCOUNTER — Other Ambulatory Visit (HOSPITAL_COMMUNITY): Payer: Self-pay

## 2021-02-01 ENCOUNTER — Telehealth: Payer: Self-pay | Admitting: Pharmacy Technician

## 2021-02-01 NOTE — Telephone Encounter (Signed)
Received notification from [employer]) regarding a prior authorization for ADVAIR . Authorization has been APPROVED from 1.10.23 to 1.10.24.   Per test claim with [facility], copay for 30 days supply is $10   Authorization # PA Case ID: [id number]     Patient Advocate Encounter  Received notification from [employer]) that prior authorization for ADVAIR is required.   PA submitted on 1.10.23 Key [id number] Status is pending   [facility] will continue to follow  Ricke Hey, CPhT Patient Advocate [facility] Phone: [phone] Fax:  [phone]

## 2021-02-02 NOTE — Telephone Encounter (Signed)
Attempted to contact patient to verify which pharmacy to send medications to as she has two listed in chart.   Voicemail has not been set up yet. Medication has been pended.

## 2021-02-03 NOTE — Telephone Encounter (Signed)
Tried calling the pt and there was no answer and VM not set up yet. Closing per protocol.

## 2021-03-07 ENCOUNTER — Ambulatory Visit: Payer: Managed Care, Other (non HMO) | Admitting: Pulmonary Disease

## 2022-10-19 IMAGING — CT CT ANGIO CHEST
2 of 7 series · 18 of 46 positions shown · IV contrast (APPLIED)
Comparison: Chest x-ray from earlier in the same day.

CLINICAL DATA: Elevated D-dimer and chest pain, initial encounter

EXAM:
CT ANGIOGRAPHY CHEST WITH CONTRAST
TECHNIQUE: Multidetector CT imaging of the chest was performed using the
standard protocol during bolus administration of intravenous
contrast. Multiplanar CT image reconstructions and MIPs were
obtained to evaluate the vascular anatomy.
CONTRAST:  50mL OMNIPAQUE IOHEXOL 350 MG/ML SOLN

[Series 7: thins · axial · 0.62mm/px · z∈[+911,+1124]mm · 15 of 345 slices shown]
[im 20/345  lung]
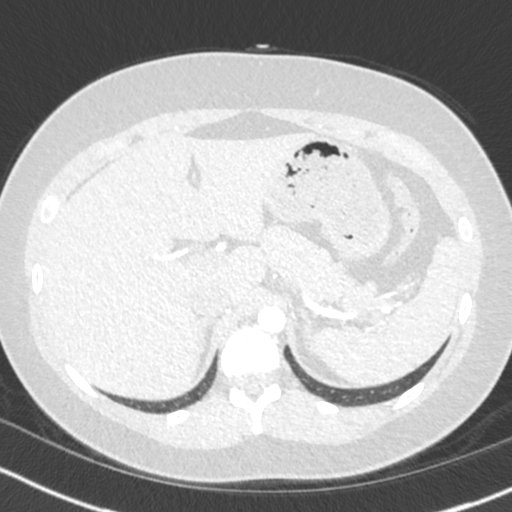
[im 39/345  soft-tissue]
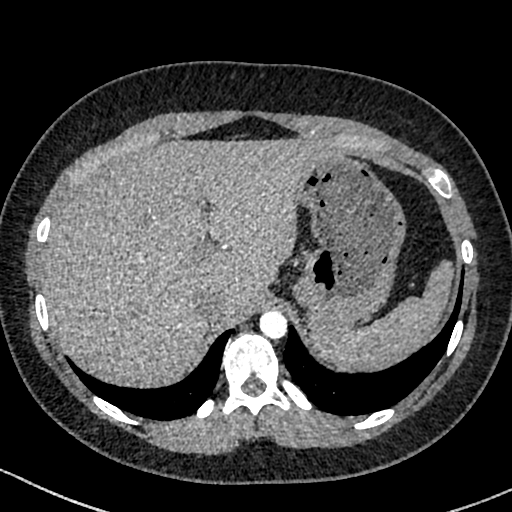
[im 58/345  lung]
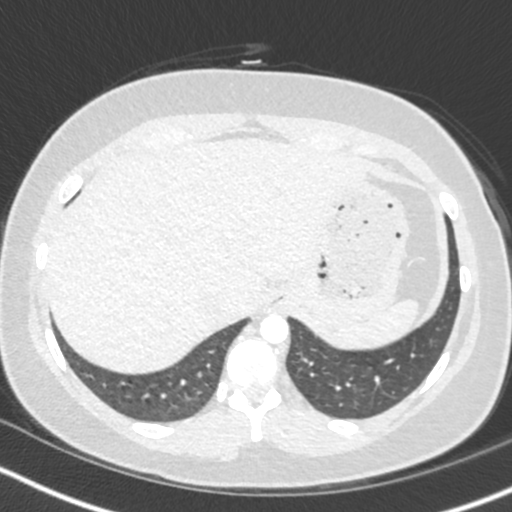
[im 77/345  soft-tissue]
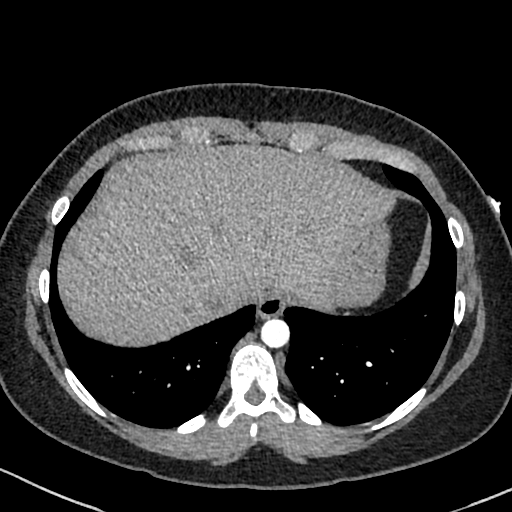
[im 115/345  lung]
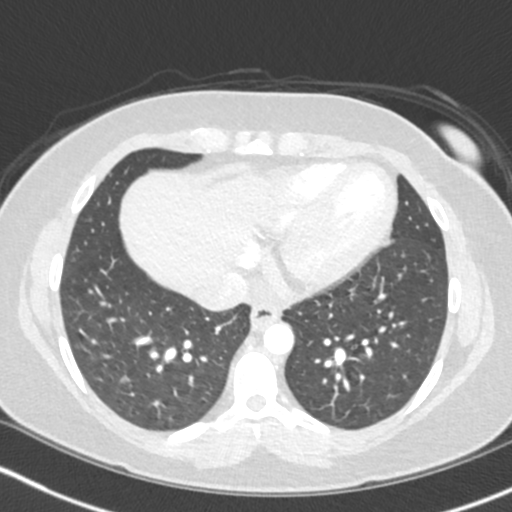
[im 134/345  soft-tissue]
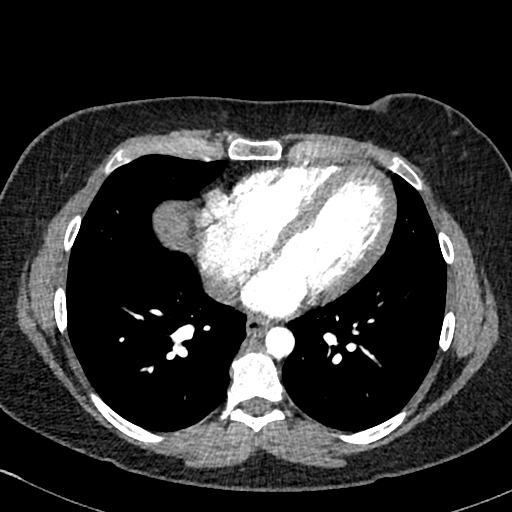
[im 153/345  lung]
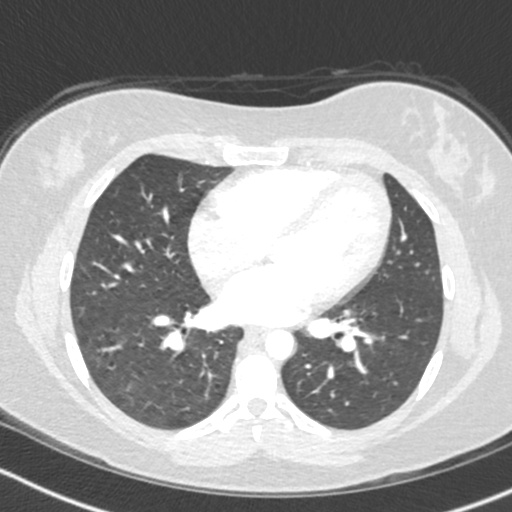
[im 173/345  soft-tissue]
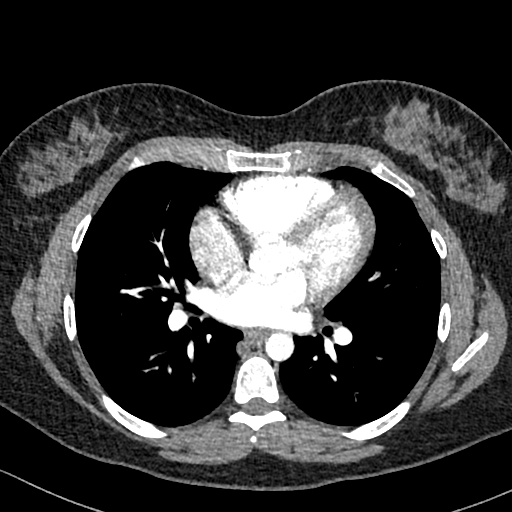
[im 192/345  lung]
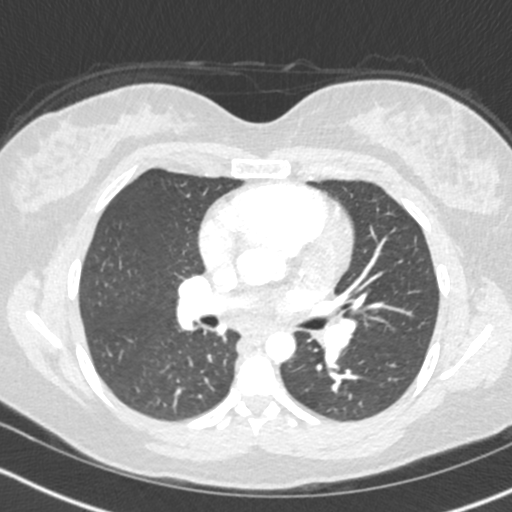
[im 211/345  soft-tissue]
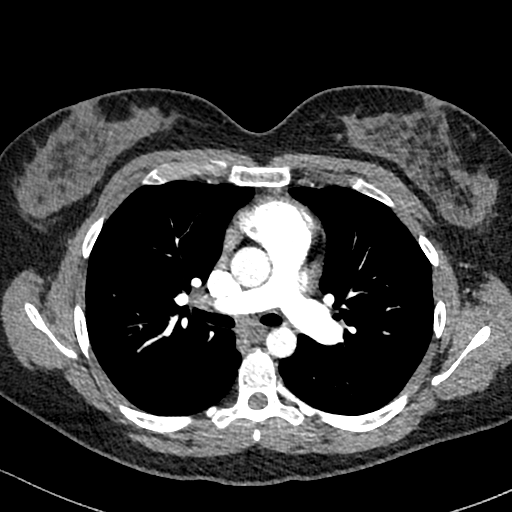
[im 230/345  lung]
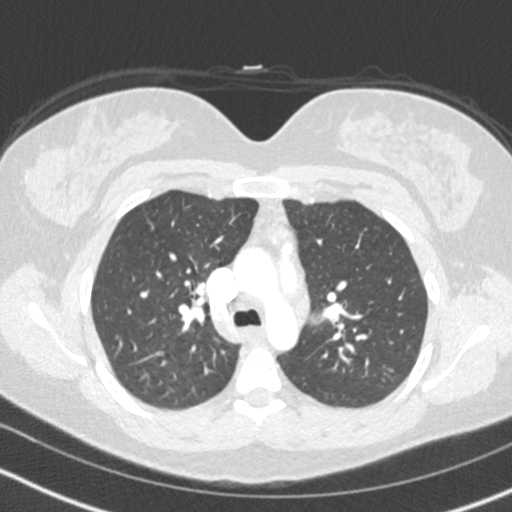
[im 268/345  soft-tissue]
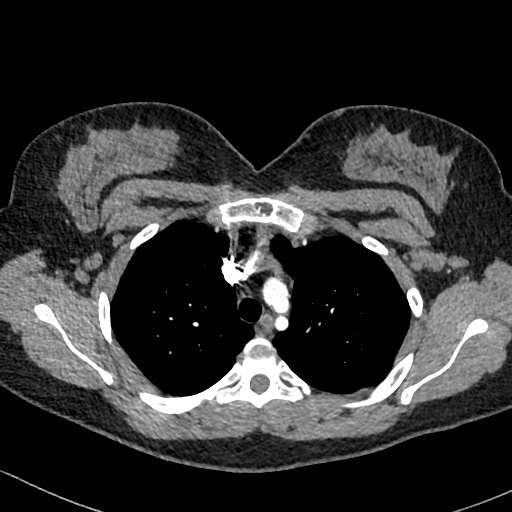
[im 287/345  lung]
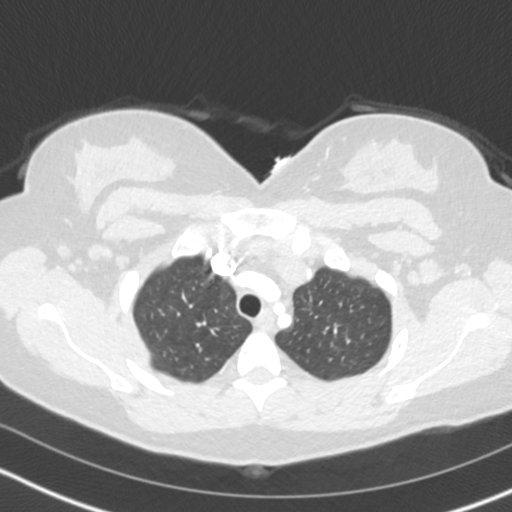
[im 306/345  soft-tissue]
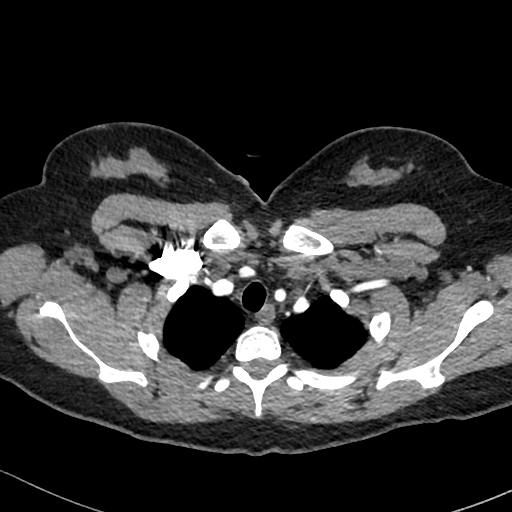
[im 325/345  lung]
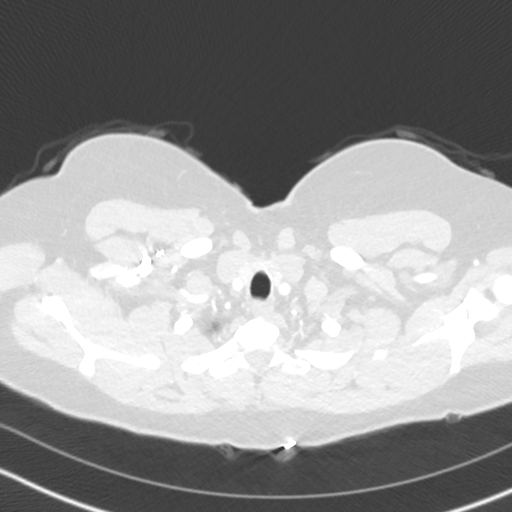

[Series 8: cor · coronal · 0.49mm/px · 3 of 139 slices shown]
[im 35/139  soft-tissue]
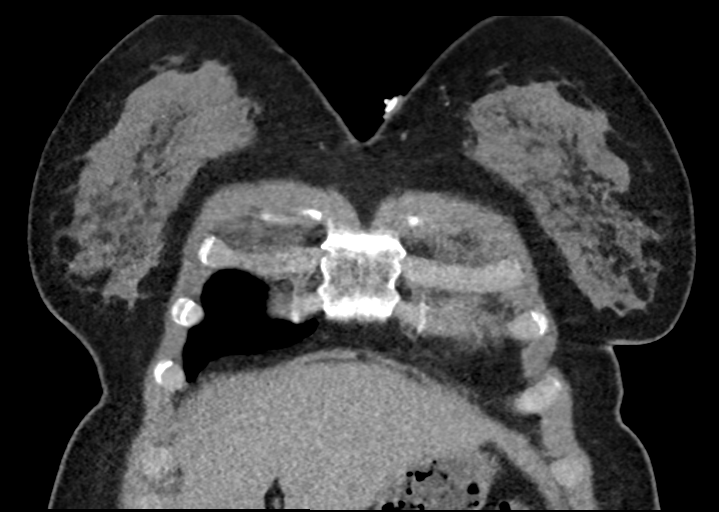
[im 70/139  soft-tissue]
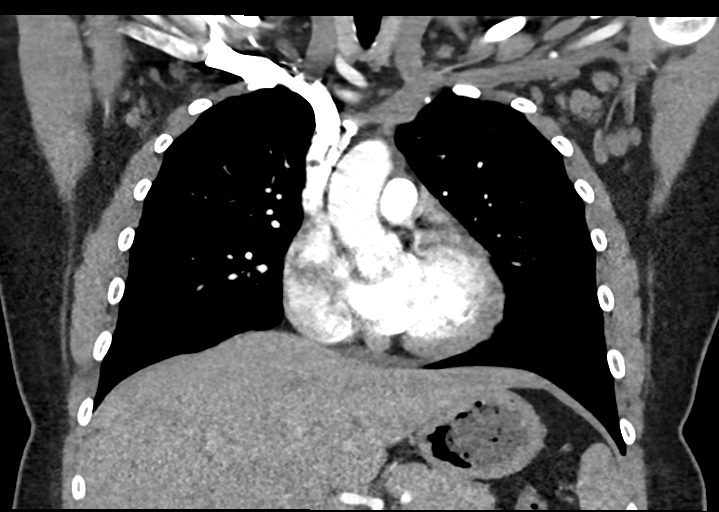
[im 104/139  soft-tissue]
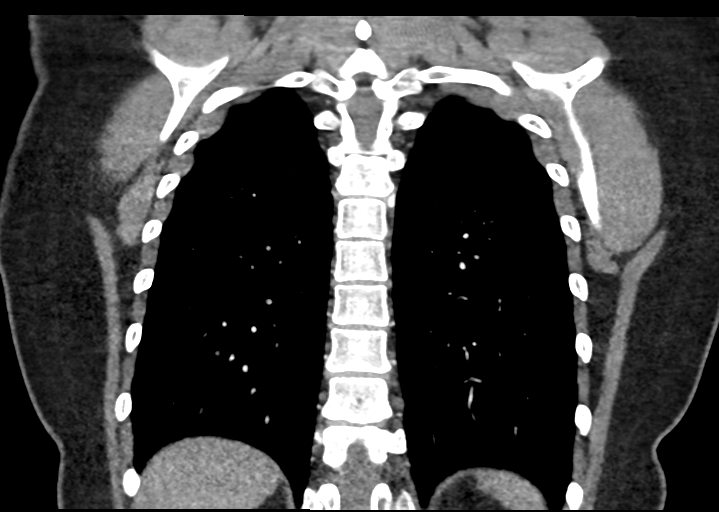

[18 of 46 positions shown; findings below may reference images not displayed]

FINDINGS: Cardiovascular: Thoracic aorta and its branches show no significant
atherosclerotic calcifications. No aneurysmal dilatation or
dissection is seen. No cardiac enlargement is noted. Pulmonary
artery shows a normal branching pattern bilaterally. No intraluminal
filling defect to suggest pulmonary embolism is identified.

Mediastinum/Nodes: Thoracic inlet is within normal limits. No
sizable hilar or mediastinal adenopathy is noted. Scattered mildly
prominent but symmetrical axillary lymph nodes are noted measuring
up to 10 mm in short axis. The esophagus as visualized is within
normal limits.

Lungs/Pleura: Lungs are well aerated bilaterally. No focal
infiltrate or sizable effusion is seen. No parenchymal nodules are
noted.

Upper Abdomen: Visualized upper abdomen is unremarkable.

Musculoskeletal: No acute rib abnormality is noted. No acute bony
abnormality is seen.

Review of the MIP images confirms the above findings.
IMPRESSION: No evidence of pulmonary emboli.

No infiltrate or effusion is seen.

Symmetrical axillary lymph nodes are seen and mildly prominent
likely reactive in nature. No other focal abnormality is identified.

## 2022-10-19 IMAGING — CR DG CHEST 2V
2 series · 2 of 2 positions shown · non-contrast
Comparison: None.

CLINICAL DATA: Left-sided chest pain.

EXAM:
CHEST - 2 VIEW

[chest pa]
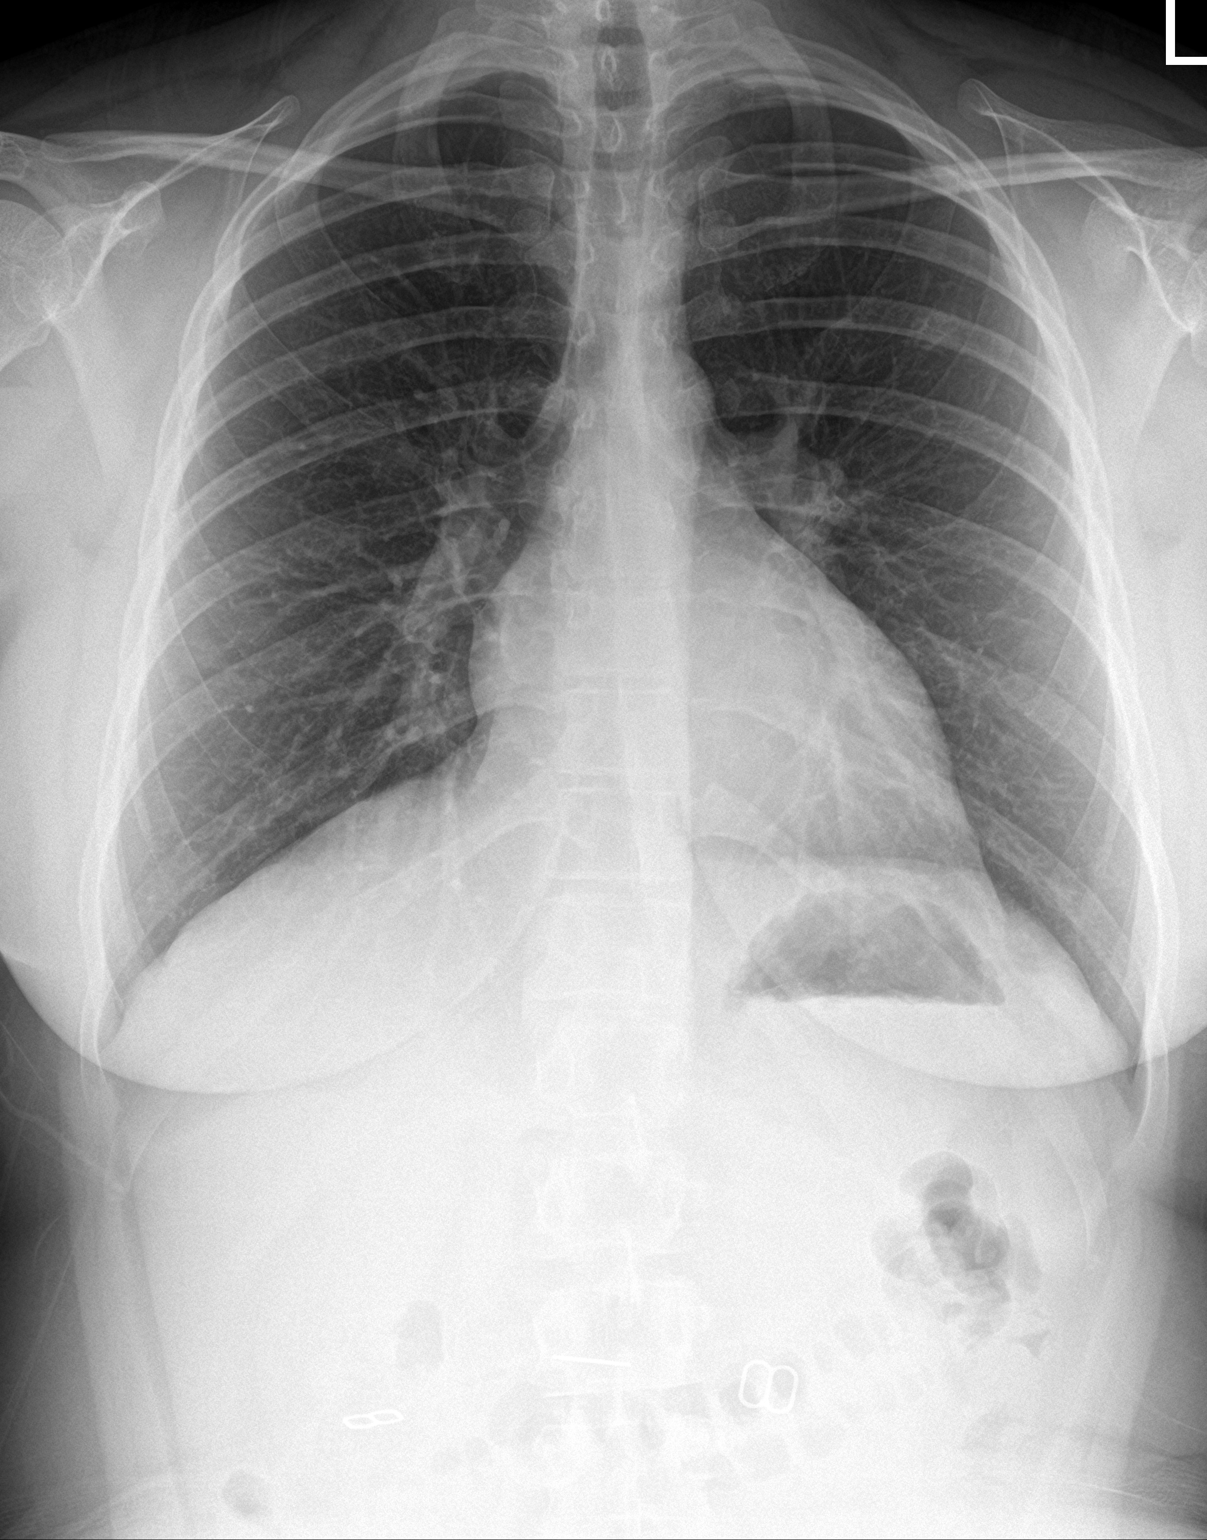

[chest lat]
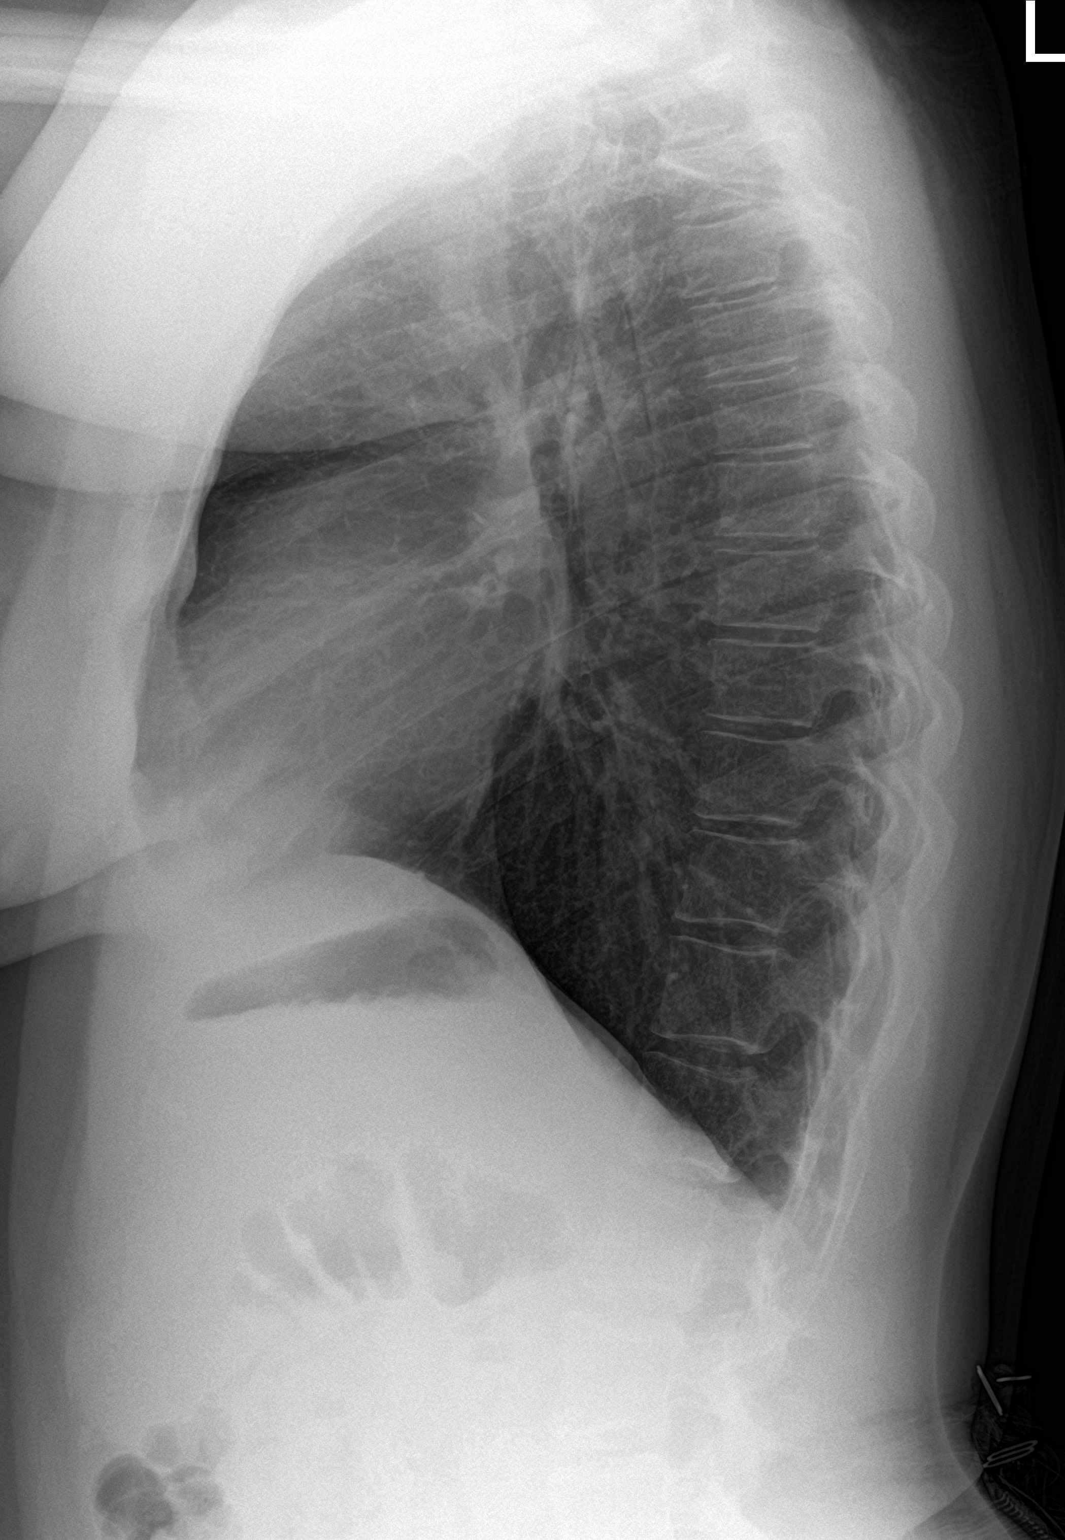

[2 of 2 positions shown; findings below may reference images not displayed]

FINDINGS: The cardiomediastinal contours are normal. The lungs are clear.
Pulmonary vasculature is normal. No consolidation, pleural effusion,
or pneumothorax. No acute osseous abnormalities are seen.
IMPRESSION: Negative radiographs of the chest.

## 2022-11-28 ENCOUNTER — Ambulatory Visit: Payer: Managed Care, Other (non HMO) | Admitting: Podiatry

## 2022-11-28 ENCOUNTER — Encounter: Payer: Self-pay | Admitting: Podiatry

## 2022-11-28 DIAGNOSIS — D649 Anemia, unspecified: Secondary | ICD-10-CM | POA: Insufficient documentation

## 2022-11-28 DIAGNOSIS — L03119 Cellulitis of unspecified part of limb: Secondary | ICD-10-CM | POA: Diagnosis not present

## 2022-11-28 DIAGNOSIS — L259 Unspecified contact dermatitis, unspecified cause: Secondary | ICD-10-CM | POA: Diagnosis not present

## 2022-11-28 MED ORDER — METHYLPREDNISOLONE 4 MG PO TBPK: ORAL_TABLET | ORAL | 0 refills | Status: DC

## 2022-11-28 MED ORDER — CLINDAMYCIN HCL 150 MG PO CAPS: 150.0000 mg | ORAL_CAPSULE | Freq: Three times a day (TID) | ORAL | 1 refills | Status: DC

## 2022-11-28 MED ORDER — TERBINAFINE HCL 250 MG PO TABS: 250.0000 mg | ORAL_TABLET | Freq: Every day | ORAL | 0 refills | Status: DC

## 2022-11-28 NOTE — Progress Notes (Signed)
She presents today chief concern of a rash to the ankles and the dorsal aspect of the left foot.  States this started on the left now spread to the right is very itchy very red she has tried over-the-counter creams Vaseline seems to help a little bit but really is some spreading and she is concerned about it.  Her feet are starting to swell as well.  Objective: Vital signs are stable she is alert and oriented x 3 there is positive erythema positive edema she has some dry scaly flakes off some of this is interdigital but she has large plaques on the dorsal and dorsal lateral aspect of the left ankle and the medial aspect of the right ankle.  Pulses are strongly palpable there is no venous insufficiency identified.  Assessment: Contact dermatitis most likely with cellulitic infection cannot rule out some athlete's foot or a tinea type infection associated with it.  Plan: Discussed etiology pathology and surgical therapies at this point I did recommend an oatmeal bath on a daily basis for her feet also recommended that she start 250 mg tablets of terbinafine.  She will start clindamycin as well as methylprednisolone.  I would like to follow-up with her in 1 month worsening she will notify us immediately.

## 2022-12-28 ENCOUNTER — Ambulatory Visit: Payer: Managed Care, Other (non HMO) | Admitting: Podiatry

## 2023-01-30 ENCOUNTER — Encounter: Payer: Self-pay | Admitting: Podiatry

## 2023-01-30 ENCOUNTER — Ambulatory Visit: Payer: Managed Care, Other (non HMO) | Admitting: Podiatry

## 2023-01-30 DIAGNOSIS — L03119 Cellulitis of unspecified part of limb: Secondary | ICD-10-CM | POA: Diagnosis not present

## 2023-01-30 DIAGNOSIS — L259 Unspecified contact dermatitis, unspecified cause: Secondary | ICD-10-CM

## 2023-01-30 DIAGNOSIS — L309 Dermatitis, unspecified: Secondary | ICD-10-CM

## 2023-01-30 MED ORDER — METHYLPREDNISOLONE 4 MG PO TBPK: ORAL_TABLET | ORAL | 0 refills | Status: DC

## 2023-01-30 NOTE — Progress Notes (Signed)
 She presents today for follow-up of her dermatitis and cellulitis to her left foot primarily there is reoccurrence after started to get better with medication we provided her last visit.  Objective: Findings are exactly the same as they were previously with eczema with superimposed tinea infection.  Assessment eczema.  Plan: Recommended that she follow-up with dermatology.

## 2023-12-24 ENCOUNTER — Encounter: Payer: Self-pay | Admitting: Obstetrics & Gynecology

## 2023-12-24 ENCOUNTER — Other Ambulatory Visit (HOSPITAL_COMMUNITY)
Admission: RE | Admit: 2023-12-24 | Discharge: 2023-12-24 | Disposition: A | Discharge status: Home / Self Care | Source: Ambulatory Visit | Attending: Obstetrics & Gynecology | Admitting: Obstetrics & Gynecology

## 2023-12-24 ENCOUNTER — Ambulatory Visit: Admitting: Obstetrics & Gynecology

## 2023-12-24 VITALS — BP 122/76 | HR 90 | Ht 62.0 in | Wt 194.4 lb

## 2023-12-24 DIAGNOSIS — Z124 Encounter for screening for malignant neoplasm of cervix: Secondary | ICD-10-CM | POA: Insufficient documentation

## 2023-12-24 DIAGNOSIS — D500 Iron deficiency anemia secondary to blood loss (chronic): Secondary | ICD-10-CM | POA: Diagnosis not present

## 2023-12-24 DIAGNOSIS — N920 Excessive and frequent menstruation with regular cycle: Secondary | ICD-10-CM | POA: Diagnosis not present

## 2023-12-24 DIAGNOSIS — N946 Dysmenorrhea, unspecified: Secondary | ICD-10-CM

## 2023-12-24 DIAGNOSIS — Z113 Encounter for screening for infections with a predominantly sexual mode of transmission: Secondary | ICD-10-CM

## 2023-12-24 DIAGNOSIS — Z30015 Encounter for initial prescription of vaginal ring hormonal contraceptive: Secondary | ICD-10-CM | POA: Diagnosis not present

## 2023-12-24 MED ORDER — ETONOGESTREL-ETHINYL ESTRADIOL 0.12-0.015 MG/24HR VA RING: VAGINAL_RING | VAGINAL | 4 refills | Status: AC

## 2023-12-24 MED ORDER — ETONOGESTREL-ETHINYL ESTRADIOL 0.12-0.015 MG/24HR VA RING: VAGINAL_RING | VAGINAL | 4 refills | Status: DC

## 2023-12-24 NOTE — Progress Notes (Signed)
   GYN VISIT Patient name: Sherry Acosta MRN [id number]  Date of birth: 11-Oct-1990 Chief Complaint:   Menorrhagia (Needs pap)  History of Present Illness:   Sherry Acosta is a 33 y.o. G0P0000  female being seen today for the following concerns:  HMB:  For the past 4-5 yrs- notes severe cramping, in bed and may have to miss work.  Tried Midol, ibuprofen and heating pad with occasional improvement.    Menses will typically start light then Day 2-4 really heavy- wearing period underwear with frequent changes maybe every few hours.    She also notes intramenstrual bleeding on occasion- seems random.  Sexually active- not currently trying to get pregnant nor interested in pregnancy anytime soon.  Notes h/o iron def. Anemia due to the bleeding.  In the past tried Junel and made the period heavier- tried for at least 6 mos.  Patient's last menstrual period was 11/30/2023.    Review of Systems:   Pertinent items are noted in HPI Denies fever/chills, dizziness, headaches, visual disturbances, fatigue, shortness of breath, chest pain, abdominal pain, vomiting, no problems with bowel movements, urination, or intercourse unless otherwise stated above.  Pertinent History Reviewed:  History reviewed. No pertinent surgical history.  History reviewed. No pertinent past medical history. Reviewed problem list, medications and allergies. Physical Assessment:   Vitals:   12/24/23 1539  BP: 122/76  Pulse: 90  Weight: 194 lb 6.4 oz (88.2 kg)  Height: 5' 2 (1.575 m)  Body mass index is 35.56 kg/m.       Physical Examination:   General appearance: alert, well appearing, and in no distress  Psych: mood appropriate, normal affect  Skin: warm & dry   Cardiovascular: normal heart rate noted  Respiratory: normal respiratory effort, no distress  Abdomen: soft, non-tender, no rebound or guarding  Pelvic: VULVA: normal appearing vulva with no masses, tenderness or lesions, VAGINA: normal  appearing vagina with normal color and discharge, no lesions, CERVIX: normal appearing cervix without discharge or lesions, UTERUS: uterus is normal size, shape, consistency and nontender, ADNEXA: normal adnexa in size, nontender and no masses  Extremities: no edema   Chaperone: Alan Fischer    Assessment & Plan:  1) Preventive screening -pap and SIT screening to be completed  2) HMB/Dysmenorrhea/ Anemia - Plan for pelvic ultrasound to rule out underlying etiology - Will also plan for lab work due to history of iron deficiency anemia -Discussed all management options including COC, patch, ring.  Discussed LARCs including risk benefit of each option. - Questions and concerns were addressed - Plan for trial of NuvaRing and may also skip menses  []  Should she note any issues with vaginal ring placement, contact office  OCP risk assessment: Pt denies personal history of VTE, stroke or heart attack.  Denies personal h/o breast cancer.  Pt is either a non-smoker or smoker under the age of 33yo.  Denies h/o migraines with aura   Orders Placed This Encounter  Procedures   US  PELVIC COMPLETE WITH TRANSVAGINAL   CBC   Vitamin B12   Folate   Iron and TIBC   Ferritin   HIV Antibody (routine testing w rflx)   RPR W/RFLX TO RPR TITER, TREPONEMAL AB, SCREEN AND DIAGNOSIS    Return for 3-4 mo medication follow up.   Anacaren Kohan, DO Attending Obstetrician & Gynecologist, [facility] for [employer], [facility]

## 2023-12-25 ENCOUNTER — Ambulatory Visit: Payer: Self-pay | Admitting: Obstetrics & Gynecology

## 2023-12-25 DIAGNOSIS — D500 Iron deficiency anemia secondary to blood loss (chronic): Secondary | ICD-10-CM

## 2023-12-25 LAB — SYPHILIS: RPR W/REFLEX TO RPR TITER AND TREPONEMAL ANTIBODIES, TRADITIONAL SCREENING AND DIAGNOSIS ALGORITHM: RPR Ser Ql: NONREACTIVE

## 2023-12-25 LAB — IRON AND TIBC
Iron Saturation: 8 % — CL (ref 15–55)
Iron: 32 ug/dL (ref 27–159)
Total Iron Binding Capacity: 380 ug/dL (ref 250–450)
UIBC: 348 ug/dL (ref 131–425)

## 2023-12-25 LAB — CBC
Hematocrit: 33.9 % — ABNORMAL LOW (ref 34.0–46.6)
Hemoglobin: 10.5 g/dL — ABNORMAL LOW (ref 11.1–15.9)
MCH: 25.5 pg — ABNORMAL LOW (ref 26.6–33.0)
MCHC: 31 g/dL — ABNORMAL LOW (ref 31.5–35.7)
MCV: 82 fL (ref 79–97)
Platelets: 416 x10E3/uL (ref 150–450)
RBC: 4.12 x10E6/uL (ref 3.77–5.28)
RDW: 14.7 % (ref 11.7–15.4)
WBC: 8 x10E3/uL (ref 3.4–10.8)

## 2023-12-25 LAB — HIV ANTIBODY (ROUTINE TESTING W REFLEX): HIV Screen 4th Generation wRfx: NONREACTIVE

## 2023-12-25 LAB — VITAMIN B12: Vitamin B-12: 528 pg/mL (ref 232–1245)

## 2023-12-25 LAB — FOLATE: Folate: 8.6 ng/mL (ref >3.0)

## 2023-12-25 LAB — FERRITIN: Ferritin: 13 ng/mL — ABNORMAL LOW (ref 15–150)

## 2023-12-25 MED ORDER — ACCRUFER 30 MG PO CAPS: 1.0000 | ORAL_CAPSULE | Freq: Every day | ORAL | 11 refills | Status: AC

## 2023-12-25 MED ORDER — ACCRUFER 30 MG PO CAPS: 1.0000 | ORAL_CAPSULE | Freq: Every day | ORAL | 11 refills | Status: DC

## 2023-12-26 LAB — CYTOLOGY - PAP
Chlamydia: NEGATIVE
Comment: NEGATIVE
Comment: NEGATIVE
Comment: NORMAL
Diagnosis: NEGATIVE
High risk HPV: NEGATIVE
Neisseria Gonorrhea: NEGATIVE

## 2024-03-26 ENCOUNTER — Other Ambulatory Visit

## 2024-03-26 ENCOUNTER — Ambulatory Visit: Admitting: Obstetrics & Gynecology
